# Patient Record
Sex: Female | Born: 1950 | Race: White | Hispanic: No | Marital: Single
Health system: Southern US, Community
[De-identification: ages and names within clinical notes are randomized; demographics above are authoritative.]

## PROBLEM LIST (undated history)

## (undated) DIAGNOSIS — I10 Essential (primary) hypertension: Secondary | ICD-10-CM

---

## 2006-01-27 ENCOUNTER — Ambulatory Visit: Payer: Self-pay | Admitting: Internal Medicine

## 2006-03-14 ENCOUNTER — Ambulatory Visit: Payer: Self-pay | Admitting: Unknown Physician Specialty

## 2008-01-17 ENCOUNTER — Ambulatory Visit: Payer: Self-pay | Admitting: Internal Medicine

## 2009-02-11 ENCOUNTER — Ambulatory Visit: Payer: Self-pay | Admitting: Internal Medicine

## 2011-02-02 ENCOUNTER — Ambulatory Visit: Payer: Self-pay | Admitting: Internal Medicine

## 2011-05-21 ENCOUNTER — Ambulatory Visit: Payer: Self-pay | Admitting: Unknown Physician Specialty

## 2011-05-24 LAB — PATHOLOGY REPORT

## 2013-09-20 ENCOUNTER — Ambulatory Visit: Payer: Self-pay | Admitting: Internal Medicine

## 2014-09-23 ENCOUNTER — Ambulatory Visit: Payer: Self-pay | Admitting: Internal Medicine

## 2015-08-19 ENCOUNTER — Other Ambulatory Visit: Payer: Self-pay | Admitting: Internal Medicine

## 2015-08-19 DIAGNOSIS — Z1231 Encounter for screening mammogram for malignant neoplasm of breast: Secondary | ICD-10-CM

## 2015-09-25 ENCOUNTER — Ambulatory Visit
Admission: RE | Admit: 2015-09-25 | Discharge: 2015-09-25 | Disposition: A | Discharge status: Home / Self Care | Payer: 59 | Source: Ambulatory Visit | Attending: Internal Medicine | Admitting: Internal Medicine

## 2015-09-25 DIAGNOSIS — Z1231 Encounter for screening mammogram for malignant neoplasm of breast: Secondary | ICD-10-CM | POA: Diagnosis present

## 2015-09-29 ENCOUNTER — Ambulatory Visit: Payer: Self-pay

## 2015-09-30 ENCOUNTER — Other Ambulatory Visit: Payer: Self-pay | Admitting: Internal Medicine

## 2015-09-30 DIAGNOSIS — R928 Other abnormal and inconclusive findings on diagnostic imaging of breast: Secondary | ICD-10-CM

## 2015-10-07 ENCOUNTER — Other Ambulatory Visit: Payer: Self-pay | Admitting: Internal Medicine

## 2015-10-07 DIAGNOSIS — N6489 Other specified disorders of breast: Secondary | ICD-10-CM

## 2015-10-13 ENCOUNTER — Ambulatory Visit
Admission: RE | Admit: 2015-10-13 | Discharge: 2015-10-13 | Disposition: A | Discharge status: Home / Self Care | Payer: 59 | Source: Ambulatory Visit | Attending: Internal Medicine | Admitting: Internal Medicine

## 2015-10-13 DIAGNOSIS — R928 Other abnormal and inconclusive findings on diagnostic imaging of breast: Secondary | ICD-10-CM | POA: Diagnosis present

## 2016-05-31 ENCOUNTER — Encounter: Admission: RE | Payer: Self-pay | Source: Ambulatory Visit

## 2016-05-31 ENCOUNTER — Ambulatory Visit: Admission: RE | Admit: 2016-05-31 | Payer: 59 | Source: Ambulatory Visit | Admitting: Unknown Physician Specialty

## 2016-05-31 SURGERY — COLONOSCOPY WITH PROPOFOL
Anesthesia: General

## 2016-09-17 ENCOUNTER — Other Ambulatory Visit: Payer: Self-pay | Admitting: Internal Medicine

## 2016-09-17 DIAGNOSIS — Z1231 Encounter for screening mammogram for malignant neoplasm of breast: Secondary | ICD-10-CM

## 2016-10-26 ENCOUNTER — Ambulatory Visit
Admission: RE | Admit: 2016-10-26 | Discharge: 2016-10-26 | Disposition: A | Discharge status: Home / Self Care | Payer: 59 | Source: Ambulatory Visit | Attending: Internal Medicine | Admitting: Internal Medicine

## 2016-10-26 DIAGNOSIS — Z1231 Encounter for screening mammogram for malignant neoplasm of breast: Secondary | ICD-10-CM | POA: Diagnosis not present

## 2017-09-16 ENCOUNTER — Other Ambulatory Visit: Payer: Self-pay | Admitting: Internal Medicine

## 2017-09-16 DIAGNOSIS — Z1231 Encounter for screening mammogram for malignant neoplasm of breast: Secondary | ICD-10-CM

## 2017-10-27 ENCOUNTER — Ambulatory Visit
Admission: RE | Admit: 2017-10-27 | Discharge: 2017-10-27 | Disposition: A | Discharge status: Home / Self Care | Payer: 59 | Source: Ambulatory Visit | Attending: Internal Medicine | Admitting: Internal Medicine

## 2017-10-27 DIAGNOSIS — Z1231 Encounter for screening mammogram for malignant neoplasm of breast: Secondary | ICD-10-CM | POA: Diagnosis present

## 2018-09-21 ENCOUNTER — Other Ambulatory Visit: Payer: Self-pay | Admitting: Internal Medicine

## 2018-09-21 DIAGNOSIS — Z1231 Encounter for screening mammogram for malignant neoplasm of breast: Secondary | ICD-10-CM

## 2018-11-13 ENCOUNTER — Ambulatory Visit
Admission: RE | Admit: 2018-11-13 | Discharge: 2018-11-13 | Disposition: A | Discharge status: Home / Self Care | Payer: Managed Care, Other (non HMO) | Source: Ambulatory Visit | Attending: Internal Medicine | Admitting: Internal Medicine

## 2018-11-13 DIAGNOSIS — Z1231 Encounter for screening mammogram for malignant neoplasm of breast: Secondary | ICD-10-CM | POA: Diagnosis not present

## 2019-09-27 ENCOUNTER — Other Ambulatory Visit: Payer: Self-pay | Admitting: Internal Medicine

## 2019-09-27 DIAGNOSIS — Z1231 Encounter for screening mammogram for malignant neoplasm of breast: Secondary | ICD-10-CM

## 2019-11-19 ENCOUNTER — Ambulatory Visit
Admission: RE | Admit: 2019-11-19 | Discharge: 2019-11-19 | Disposition: A | Discharge status: Home / Self Care | Payer: Managed Care, Other (non HMO) | Source: Ambulatory Visit | Attending: Internal Medicine | Admitting: Internal Medicine

## 2019-11-19 DIAGNOSIS — Z1231 Encounter for screening mammogram for malignant neoplasm of breast: Secondary | ICD-10-CM | POA: Diagnosis present

## 2020-12-19 ENCOUNTER — Other Ambulatory Visit: Payer: Self-pay | Admitting: Internal Medicine

## 2020-12-19 DIAGNOSIS — Z1231 Encounter for screening mammogram for malignant neoplasm of breast: Secondary | ICD-10-CM

## 2020-12-30 ENCOUNTER — Other Ambulatory Visit: Payer: Self-pay

## 2020-12-30 ENCOUNTER — Ambulatory Visit
Admission: RE | Admit: 2020-12-30 | Discharge: 2020-12-30 | Disposition: A | Discharge status: Home / Self Care | Payer: Managed Care, Other (non HMO) | Source: Ambulatory Visit | Attending: Internal Medicine | Admitting: Internal Medicine

## 2020-12-30 DIAGNOSIS — Z1231 Encounter for screening mammogram for malignant neoplasm of breast: Secondary | ICD-10-CM | POA: Insufficient documentation

## 2021-05-04 ENCOUNTER — Other Ambulatory Visit (HOSPITAL_COMMUNITY): Payer: Self-pay | Admitting: Specialist

## 2021-05-04 DIAGNOSIS — R0609 Other forms of dyspnea: Secondary | ICD-10-CM

## 2021-05-04 DIAGNOSIS — R0789 Other chest pain: Secondary | ICD-10-CM

## 2021-05-19 ENCOUNTER — Telehealth (HOSPITAL_COMMUNITY): Payer: Self-pay | Admitting: Emergency Medicine

## 2021-05-19 DIAGNOSIS — R079 Chest pain, unspecified: Secondary | ICD-10-CM

## 2021-05-19 MED ORDER — METOPROLOL TARTRATE 100 MG PO TABS: 100.0000 mg | ORAL_TABLET | Freq: Once | ORAL | 0 refills | Status: AC

## 2021-05-19 NOTE — Telephone Encounter (Signed)
Reaching out to patient to offer assistance regarding upcoming cardiac imaging study; pt verbalizes understanding of appt date/time, parking situation and where to check in, pre-test NPO status and medications ordered, and verified current allergies; name and call back number provided for further questions should they arise Rockwell Alexandria RN Navigator Cardiac Imaging [facility] [phone] office [phone] cell  [facility] this morning to request patient come have BMP drawn for CCTA on thur. Pt states she had labs drawn already (results pending)  100mg  metoprolol tartrate one time dose prescribed for HR control for CCTA. Sent to patients preferred pharmacy on file. Instructed patient to take 2 hr prior to scan  Denies iv issues Denies claustro 

## 2021-05-21 ENCOUNTER — Other Ambulatory Visit: Payer: Self-pay

## 2021-05-21 ENCOUNTER — Ambulatory Visit
Admission: RE | Admit: 2021-05-21 | Discharge: 2021-05-21 | Disposition: A | Discharge status: Home / Self Care | Payer: Medicare Other | Source: Ambulatory Visit | Attending: Specialist | Admitting: Specialist

## 2021-05-21 DIAGNOSIS — R0600 Dyspnea, unspecified: Secondary | ICD-10-CM | POA: Diagnosis not present

## 2021-05-21 DIAGNOSIS — R0789 Other chest pain: Secondary | ICD-10-CM | POA: Diagnosis present

## 2021-05-21 DIAGNOSIS — R0609 Other forms of dyspnea: Secondary | ICD-10-CM

## 2021-05-21 DIAGNOSIS — I10 Essential (primary) hypertension: Secondary | ICD-10-CM | POA: Insufficient documentation

## 2021-05-21 HISTORY — DX: Essential (primary) hypertension: I10

## 2021-05-21 MED ORDER — NITROGLYCERIN 0.4 MG SL SUBL
0.8000 mg | SUBLINGUAL_TABLET | Freq: Once | SUBLINGUAL | Status: AC
  Administered 2021-05-21: 0.8 mg via SUBLINGUAL

## 2021-05-21 MED ORDER — IOHEXOL 350 MG/ML SOLN
75.0000 mL | Freq: Once | INTRAVENOUS | Status: AC | PRN
  Administered 2021-05-21: 75 mL via INTRAVENOUS

## 2021-05-21 NOTE — Progress Notes (Signed)
Patient tolerated procedure well. Ambulate w/o difficulty. Sitting in chair drinking water provided. Encouraged to drink extra water today and reasoning explained. Verbalized understanding. All questions answered. ABC intact. No further needs. Discharge from procedure area w/o issues.  

## 2021-11-23 ENCOUNTER — Other Ambulatory Visit: Payer: Self-pay | Admitting: Internal Medicine

## 2021-11-23 DIAGNOSIS — Z1231 Encounter for screening mammogram for malignant neoplasm of breast: Secondary | ICD-10-CM

## 2021-12-31 ENCOUNTER — Other Ambulatory Visit: Payer: Self-pay

## 2021-12-31 ENCOUNTER — Ambulatory Visit
Admission: RE | Admit: 2021-12-31 | Discharge: 2021-12-31 | Disposition: A | Discharge status: Home / Self Care | Payer: Medicare Other | Source: Ambulatory Visit | Attending: Internal Medicine | Admitting: Internal Medicine

## 2021-12-31 DIAGNOSIS — Z1231 Encounter for screening mammogram for malignant neoplasm of breast: Secondary | ICD-10-CM | POA: Insufficient documentation

## 2022-11-23 ENCOUNTER — Other Ambulatory Visit: Payer: Self-pay | Admitting: Internal Medicine

## 2022-11-23 DIAGNOSIS — Z1231 Encounter for screening mammogram for malignant neoplasm of breast: Secondary | ICD-10-CM

## 2023-01-03 ENCOUNTER — Ambulatory Visit
Admission: RE | Admit: 2023-01-03 | Discharge: 2023-01-03 | Disposition: A | Discharge status: Home / Self Care | Payer: Medicare Other | Source: Ambulatory Visit | Attending: Internal Medicine | Admitting: Internal Medicine

## 2023-01-03 DIAGNOSIS — Z1231 Encounter for screening mammogram for malignant neoplasm of breast: Secondary | ICD-10-CM | POA: Diagnosis present

## 2023-03-29 IMAGING — CT CT HEART MORP W/ CTA COR W/ SCORE W/ CA W/CM &/OR W/O CM
1 of 14 series · 3 of 20 positions shown, 4 images · non-contrast
Comparison: None.

Addendum:
CLINICAL DATA: Chest pain, shortness of breath

EXAM:
Cardiac/Coronary  CTA
TECHNIQUE: The patient was scanned on a Siemens Somatom go.Top scanner.

[Series 26: multiphase % cta coronary 0.60 · axial · 0.33mm/px · z∈[-1108,-1046]mm · 3 of 3443 slices shown, 4 images]
[im 861/3443  vessel]
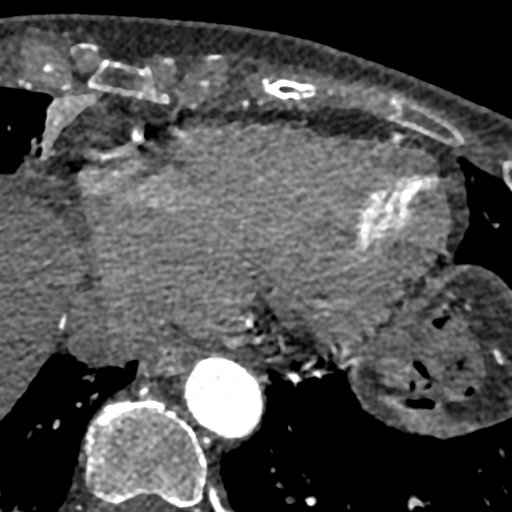
[im 861/3443  lung]
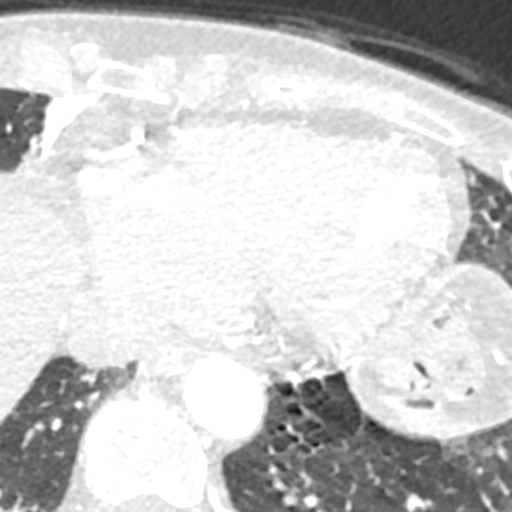
[im 1722/3443  vessel]
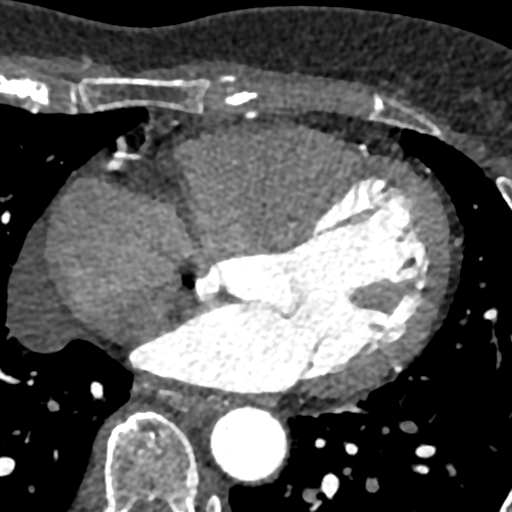
[im 2582/3443  vessel]
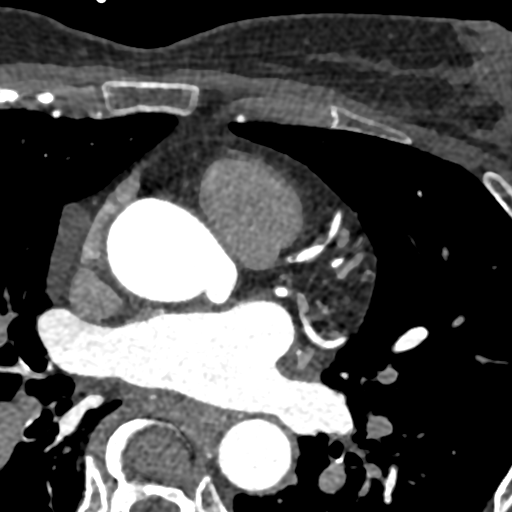

[3 of 20 positions shown; findings below may reference images not displayed]



Aortic Valve:  Trileaflet.  No calcifications.

Coronary Arteries:  Normal coronary origin.  Right dominance.

RCA is a dominant artery that gives rise to PDA and PLA. There is no
plaque.

Left main gives rise to LAD and LCX arteries. There is no disease in
the left main.

LAD has no plaque.

LCX is a non-dominant artery that gives rise to one large OM1
branch. There is no plaque.

Other findings:

Normal pulmonary vein drainage into the left atrium.

Normal left atrial appendage without a thrombus.

Normal size of the pulmonary artery.
IMPRESSION: 1. Normal coronary calcium score of 0. Patient is low risk for
coronary events.

2. Normal coronary origin with right dominance.

3. No evidence of CAD.

4. CAD-RADS 0. Consider non-atherosclerotic causes of chest pain.

EXAM:
OVER-READ INTERPRETATION  CT CHEST

The following report is an over-read performed by radiologist Dr.
Genny Soderberg [[email or web address]] on 05/22/2021. This over-read
does not include interpretation of cardiac or coronary anatomy or
pathology. The coronary CTA interpretation by the cardiologist is
attached.
FINDINGS: The visualized portions of the lower lung fields show no suspicious
nodules, masses, or infiltrates. No pleural fluid seen.

4.0 x 2.2 cm homogeneous water density lesion adjacent to the right
heart demonstrates no perceptible wall or internal architecture. The
visualized portions of the mediastinum and chest wall are otherwise
unremarkable.
IMPRESSION: 4.0 x 2.2 cm homogeneous water density lesion adjacent to the right
heart border. Imaging features are most suggestive of a pericardial
cyst. Follow-up CT chest in 3 months recommended to ensure
stability.



Aortic Valve:  Trileaflet.  No calcifications.

Coronary Arteries:  Normal coronary origin.  Right dominance.

RCA is a dominant artery that gives rise to PDA and PLA. There is no
plaque.

Left main gives rise to LAD and LCX arteries. There is no disease in
the left main.

LAD has no plaque.

LCX is a non-dominant artery that gives rise to one large OM1
branch. There is no plaque.

Other findings:

Normal pulmonary vein drainage into the left atrium.

Normal left atrial appendage without a thrombus.

Normal size of the pulmonary artery.
IMPRESSION: 1. Normal coronary calcium score of 0. Patient is low risk for
coronary events.

2. Normal coronary origin with right dominance.

3. No evidence of CAD.

4. CAD-RADS 0. Consider non-atherosclerotic causes of chest pain.

## 2023-11-28 ENCOUNTER — Other Ambulatory Visit: Payer: Self-pay | Admitting: Internal Medicine

## 2023-11-28 DIAGNOSIS — Z1231 Encounter for screening mammogram for malignant neoplasm of breast: Secondary | ICD-10-CM

## 2024-01-09 ENCOUNTER — Ambulatory Visit
Admission: RE | Admit: 2024-01-09 | Discharge: 2024-01-09 | Disposition: A | Discharge status: Home / Self Care | Payer: Medicare Other | Source: Ambulatory Visit | Attending: Internal Medicine | Admitting: Internal Medicine

## 2024-01-09 DIAGNOSIS — Z1231 Encounter for screening mammogram for malignant neoplasm of breast: Secondary | ICD-10-CM | POA: Insufficient documentation

## 2024-11-12 ENCOUNTER — Ambulatory Visit: Admitting: Anesthesiology

## 2024-11-12 ENCOUNTER — Ambulatory Visit
Admission: RE | Admit: 2024-11-12 | Discharge: 2024-11-12 | Disposition: A | Discharge status: Home / Self Care | Attending: Gastroenterology | Admitting: Gastroenterology

## 2024-11-12 ENCOUNTER — Encounter: Payer: Self-pay | Admitting: Gastroenterology

## 2024-11-12 ENCOUNTER — Encounter
Admission: RE | Disposition: A | Discharge status: Home / Self Care | Payer: Self-pay | Source: Home / Self Care | Attending: Gastroenterology

## 2024-11-12 DIAGNOSIS — Z79899 Other long term (current) drug therapy: Secondary | ICD-10-CM | POA: Diagnosis not present

## 2024-11-12 DIAGNOSIS — D124 Benign neoplasm of descending colon: Secondary | ICD-10-CM | POA: Diagnosis not present

## 2024-11-12 DIAGNOSIS — Z800 Family history of malignant neoplasm of digestive organs: Secondary | ICD-10-CM | POA: Insufficient documentation

## 2024-11-12 DIAGNOSIS — Z1211 Encounter for screening for malignant neoplasm of colon: Secondary | ICD-10-CM | POA: Diagnosis present

## 2024-11-12 DIAGNOSIS — I10 Essential (primary) hypertension: Secondary | ICD-10-CM | POA: Insufficient documentation

## 2024-11-12 DIAGNOSIS — J45909 Unspecified asthma, uncomplicated: Secondary | ICD-10-CM | POA: Insufficient documentation

## 2024-11-12 DIAGNOSIS — K641 Second degree hemorrhoids: Secondary | ICD-10-CM | POA: Diagnosis not present

## 2024-11-12 DIAGNOSIS — Z83719 Family history of colon polyps, unspecified: Secondary | ICD-10-CM | POA: Insufficient documentation

## 2024-11-12 HISTORY — PX: COLONOSCOPY: SHX5424

## 2024-11-12 HISTORY — PX: POLYPECTOMY: SHX149

## 2024-11-12 SURGERY — COLONOSCOPY
Anesthesia: General

## 2024-11-12 MED ORDER — SODIUM CHLORIDE 0.9 % IV SOLN: INTRAVENOUS | Status: DC

## 2024-11-12 MED ORDER — PROPOFOL 10 MG/ML IV BOLUS
INTRAVENOUS | Status: DC | PRN
  Administered 2024-11-12 (×4): 50 mg via INTRAVENOUS

## 2024-11-12 MED ORDER — LIDOCAINE HCL (CARDIAC) PF 100 MG/5ML IV SOSY
PREFILLED_SYRINGE | INTRAVENOUS | Status: DC | PRN
  Administered 2024-11-12: 60 mg via INTRAVENOUS

## 2024-11-12 MED ORDER — ONDANSETRON HCL 4 MG/2ML IJ SOLN
INTRAMUSCULAR | Status: AC
  Filled 2024-11-12: qty 2

## 2024-11-12 MED ORDER — DEXMEDETOMIDINE HCL IN NACL 80 MCG/20ML IV SOLN
INTRAVENOUS | Status: DC | PRN
  Administered 2024-11-12: 12 ug via INTRAVENOUS
  Administered 2024-11-12: 8 ug via INTRAVENOUS

## 2024-11-12 MED ORDER — LIDOCAINE HCL (PF) 2 % IJ SOLN
INTRAMUSCULAR | Status: AC
  Filled 2024-11-12: qty 5

## 2024-11-12 MED ORDER — PROPOFOL 500 MG/50ML IV EMUL
INTRAVENOUS | Status: DC | PRN
  Administered 2024-11-12: 75 ug/kg/min via INTRAVENOUS

## 2024-11-12 NOTE — Anesthesia Postprocedure Evaluation (Signed)
"   Anesthesia Post Note  Patient: Nancy Ho [facility]  Procedure(s) Performed: COLONOSCOPY POLYPECTOMY, INTESTINE  Patient location during evaluation: PACU Anesthesia Type: General Level of consciousness: awake Pain management: pain level controlled Vital Signs Assessment: post-procedure vital signs reviewed and stable Cardiovascular status: stable Anesthetic complications: no   No notable events documented.   Last Vitals:  Vitals:   11/12/24 1235 11/12/24 1247  BP: 120/73 124/75  Pulse: 73 71  Resp: 11 12  Temp:    SpO2: 99% 99%    Last Pain:  Vitals:   11/12/24 1235  TempSrc:   PainSc: 0-No pain                 VAN STAVEREN,[place] Mokry      "

## 2024-11-12 NOTE — Op Note (Signed)
 [facility] Gastroenterology Patient Name: Nancy Ho Procedure Date: 11/12/2024 11:41 AM MRN: [id number] Account #: [id number] Date of Birth: [birth year] Admit Type: Outpatient Age: 74 Room: [facility] ENDO ROOM 2 Gender: Female Note Status: Finalized Instrument Name: Colon Scope [id number] Procedure:             Colonoscopy Indications:           High risk colon cancer surveillance: Personal history                         of colonic polyps, Family history of colon cancer                         (father 56s) Providers:             Elspeth Ozell Onita ROSALEA, DO Referring MD:          Layman ORN. Lenon MD, MD (Referring MD) Medicines:             Monitored Anesthesia Care Complications:         No immediate complications. Estimated blood loss:                         Minimal. Procedure:             Pre-Anesthesia Assessment:                        - Prior to the procedure, a History and Physical was                         performed, and patient medications and allergies were                         reviewed. The patient is competent. The risks and                         benefits of the procedure and the sedation options and                         risks were discussed with the patient. All questions                         were answered and informed consent was obtained.                         Patient identification and proposed procedure were                         verified by the physician, the nurse, the anesthetist                         and the technician in the endoscopy suite. Mental                         Status Examination: alert and oriented. Airway                         Examination: normal oropharyngeal airway and neck  mobility. Respiratory Examination: clear to                         auscultation. CV Examination: RRR, no murmurs, no S3                         or S4. Prophylactic Antibiotics: The patient does not                          require prophylactic antibiotics. Prior                         Anticoagulants: The patient has taken no anticoagulant                         or antiplatelet agents. ASA Grade Assessment: II - A                         patient with mild systemic disease. After reviewing                         the risks and benefits, the patient was deemed in                         satisfactory condition to undergo the procedure. The                         anesthesia plan was to use monitored anesthesia care                         (MAC). Immediately prior to administration of                         medications, the patient was re-assessed for adequacy                         to receive sedatives. The heart rate, respiratory                         rate, oxygen saturations, blood pressure, adequacy of                         pulmonary ventilation, and response to care were                         monitored throughout the procedure. The physical                         status of the patient was re-assessed after the                         procedure.                        After obtaining informed consent, the colonoscope was                         passed under direct vision. Throughout the procedure,  the patient's blood pressure, pulse, and oxygen                         saturations were monitored continuously. The                         Colonoscope was introduced through the anus and                         advanced to the the cecum, identified by appendiceal                         orifice and ileocecal valve. The colonoscopy was                         performed without difficulty. The patient tolerated                         the procedure well. The quality of the bowel                         preparation was evaluated using the BBPS [facility] Bowel                         Preparation Scale) with scores of: Right Colon = 2                         (minor amount of  residual staining, small fragments of                         stool and/or opaque liquid, but mucosa seen well),                         Transverse Colon = 2 (minor amount of residual                         staining, small fragments of stool and/or opaque                         liquid, but mucosa seen well) and Left Colon = 2                         (minor amount of residual staining, small fragments of                         stool and/or opaque liquid, but mucosa seen well). The                         total BBPS score equals 6. The quality of the bowel                         preparation was good. The ileocecal valve, appendiceal                         orifice, and rectum were photographed. Findings:      The perianal and digital rectal examinations were normal. Pertinent       negatives  include normal sphincter tone.      A 2 to 3 mm polyp was found in the descending colon. The polyp was       sessile. The polyp was removed with a cold snare. Resection and       retrieval were complete. Estimated blood loss was minimal.      Non-bleeding internal hemorrhoids were found during retroflexion. The       hemorrhoids were Grade II (internal hemorrhoids that prolapse but reduce       spontaneously). Estimated blood loss: none.      The exam was otherwise without abnormality on direct and retroflexion       views. Impression:            - One 2 to 3 mm polyp in the descending colon, removed                         with a cold snare. Resected and retrieved.                        - Non-bleeding internal hemorrhoids.                        - The examination was otherwise normal on direct and                         retroflexion views. Recommendation:        - Patient has a contact number available for                         emergencies. The signs and symptoms of potential                         delayed complications were discussed with the patient.                         Return to normal  activities tomorrow. Written                         discharge instructions were provided to the patient.                        - Discharge patient to home.                        - Resume previous diet.                        - Continue present medications.                        - Await pathology results.                        - Repeat colonoscopy in 5 years for surveillance based                         on pathology results.                        - Return to referring physician as previously  scheduled.                        - The findings and recommendations were discussed with                         the patient. Procedure Code(s):     --- Professional ---                        [phone], Colonoscopy, flexible; with removal of                         tumor(s), polyp(s), or other lesion(s) by snare                         technique Diagnosis Code(s):     --- Professional ---                        Z86.010, Personal history of colonic polyps                        D12.4, Benign neoplasm of descending colon                        K64.1, Second degree hemorrhoids CPT copyright 2022 American Medical Association. All rights reserved. The codes documented in this report are preliminary and upon coder review may  be revised to meet current compliance requirements. Attending Participation:      I personally performed the entire procedure. Elspeth Jungling, DO Elspeth Ozell Jungling DO, DO 11/12/2024 87:73:77 PM This report has been signed electronically. Number of Addenda: 0 Note Initiated On: 11/12/2024 11:41 AM Scope Withdrawal Time: 0 hours 17 minutes 19 seconds  Total Procedure Duration: 0 hours 26 minutes 22 seconds  Estimated Blood Loss:  Estimated blood loss was minimal.      [facility]

## 2024-11-12 NOTE — Anesthesia Preprocedure Evaluation (Addendum)
"                                    Anesthesia Evaluation  Patient identified by MRN, date of birth, ID band Patient awake    Reviewed: Allergy & Precautions, NPO status , Patient's Chart, lab work & pertinent test results  Airway Mallampati: II  TM Distance: >3 FB Neck ROM: Full    Dental  (+) Teeth Intact   Pulmonary neg pulmonary ROS, asthma    Pulmonary exam normal        Cardiovascular Exercise Tolerance: Good hypertension, Pt. on medications Normal cardiovascular exam Rhythm:Regular Rate:Normal     Neuro/Psych negative neurological ROS  negative psych ROS   GI/Hepatic negative GI ROS, Neg liver ROS,,,  Endo/Other  negative endocrine ROS    Renal/GU negative Renal ROS  negative genitourinary   Musculoskeletal   Abdominal Normal abdominal exam  (+)   Peds negative pediatric ROS (+)  Hematology negative hematology ROS (+)   Anesthesia Other Findings Past Medical History: No date: Hypertension  History reviewed. No pertinent surgical history.  BMI    Body Mass Index: 25.24 kg/m      Reproductive/Obstetrics negative OB ROS                              Anesthesia Physical Anesthesia Plan  ASA: 2  Anesthesia Plan: General   Post-op Pain Management:    Induction: Intravenous  PONV Risk Score and Plan: Propofol  infusion and TIVA  Airway Management Planned: Natural Airway and Nasal Cannula  Additional Equipment:   Intra-op Plan:   Post-operative Plan:   Informed Consent: I have reviewed the patients History and Physical, chart, labs and discussed the procedure including the risks, benefits and alternatives for the proposed anesthesia with the patient or authorized representative who has indicated his/her understanding and acceptance.     Dental Advisory Given  Plan Discussed with: CRNA  Anesthesia Plan Comments:          Anesthesia Quick Evaluation  "

## 2024-11-12 NOTE — Transfer of Care (Signed)
 Immediate Anesthesia Transfer of Care Note  Patient: Nancy Ho  Procedure(s) Performed: COLONOSCOPY POLYPECTOMY, INTESTINE  Patient Location: PACU  Anesthesia Type:General  Level of Consciousness: sedated  Airway & Oxygen Therapy: Patient Spontanous Breathing  Post-op Assessment: Report given to RN and Post -op Vital signs reviewed and stable  Post vital signs: Reviewed and stable  Last Vitals:  Vitals Value Taken Time  BP 108/69 11/12/24 12:25  Temp    Pulse 77 11/12/24 12:25  Resp 15 11/12/24 12:25  SpO2 98 % 11/12/24 12:25    Last Pain:  Vitals:   11/12/24 1012  TempSrc: Temporal  PainSc: 0-No pain         Complications: No notable events documented.

## 2024-11-12 NOTE — H&P (Signed)
 "  Pre-Procedure H&P   Patient ID: Nancy Ho is a 74 y.o. female.  Gastroenterology Provider: Elspeth Ozell Jungling, DO  Referring Provider: Lynda Rummer, PA PCP: Lenon Layman ORN, MD  Date: 11/12/2024  HPI Nancy Ho is a 74 y.o. female who presents today for Colonoscopy for Personal history of colon polyps; family history of colon cancer; family history of colon polyps.  No current GI symptoms  Last underwent colonoscopy in January 2023 with cecal polyp that was sessile serrated, and a ascending colon polyp that was not retrieved.  3-year repeat recommended CSY- 2017- [facility]  Father with history colon cancer in his 30s.  Paternal grandmother with colon cancer Brother and sister with colon polyps    Past Medical History:  Diagnosis Date   Hypertension     History reviewed. No pertinent surgical history.  Family History Father with history colon cancer in his 30s.  Paternal grandmother with colon cancer Brother and sister with colon polyps No other h/o GI disease or malignancy  Review of Systems  Constitutional:  Negative for activity change, appetite change, chills, diaphoresis, fatigue, fever and unexpected weight change.  HENT:  Negative for trouble swallowing and voice change.   Respiratory:  Negative for shortness of breath and wheezing.   Cardiovascular:  Negative for chest pain, palpitations and leg swelling.  Gastrointestinal:  Negative for abdominal distention, abdominal pain, anal bleeding, blood in stool, constipation, diarrhea, nausea, rectal pain and vomiting.  Musculoskeletal:  Negative for arthralgias and myalgias.  Skin:  Negative for color change and pallor.  Neurological:  Negative for dizziness, syncope and weakness.  Psychiatric/Behavioral:  Negative for confusion.   All other systems reviewed and are negative.    Medications Medications Ordered Prior to Encounter[1]  Pertinent medications related to GI and procedure were  reviewed by me with the patient prior to the procedure  Current Medications[2]  sodium chloride  20 mL/hr at 11/12/24 1032       Allergies[3] Allergies were reviewed by me prior to the procedure  Objective   Body mass index is 25.24 kg/m. Vitals:   11/12/24 1012  BP: (!) 163/82  Pulse: 91  Resp: 16  Temp: 97.6 F (36.4 C)  TempSrc: Temporal  SpO2: 99%  Weight: 70.9 kg  Height: 5' 6 (1.676 m)     Physical Exam Vitals and nursing note reviewed.  Constitutional:      General: She is not in acute distress.    Appearance: Normal appearance. She is not ill-appearing, toxic-appearing or diaphoretic.  HENT:     Head: Normocephalic and atraumatic.     Nose: Nose normal.     Mouth/Throat:     Mouth: Mucous membranes are moist.     Pharynx: Oropharynx is clear.  Eyes:     General: No scleral icterus.    Extraocular Movements: Extraocular movements intact.  Cardiovascular:     Rate and Rhythm: Normal rate and regular rhythm.     Heart sounds: Normal heart sounds. No murmur heard.    No friction rub. No gallop.  Pulmonary:     Effort: Pulmonary effort is normal. No respiratory distress.     Breath sounds: Normal breath sounds. No wheezing, rhonchi or rales.  Abdominal:     General: Bowel sounds are normal. There is no distension.     Palpations: Abdomen is soft.     Tenderness: There is no abdominal tenderness. There is no guarding or rebound.  Musculoskeletal:     Cervical back:  Neck supple.     Right lower leg: No edema.     Left lower leg: No edema.  Skin:    General: Skin is warm and dry.     Coloration: Skin is not jaundiced or pale.  Neurological:     General: No focal deficit present.     Mental Status: She is alert and oriented to person, place, and time. Mental status is at baseline.  Psychiatric:        Mood and Affect: Mood normal.        Behavior: Behavior normal.        Thought Content: Thought content normal.        Judgment: Judgment normal.       Assessment:  Nancy Ho is a 74 y.o. female  who presents today for Colonoscopy for Personal history of colon polyps; family history of colon cancer; family history of colon polyps.  Plan:  Colonoscopy with possible intervention today  Colonoscopy with possible biopsy, control of bleeding, polypectomy, and interventions as necessary has been discussed with the patient/patient representative. Informed consent was obtained from the patient/patient representative after explaining the indication, nature, and risks of the procedure including but not limited to death, bleeding, perforation, missed neoplasm/lesions, cardiorespiratory compromise, and reaction to medications. Opportunity for questions was given and appropriate answers were provided. Patient/patient representative has verbalized understanding is amenable to undergoing the procedure.   Elspeth Ozell Jungling, DO  [facility]  Portions of the record may have been created with voice recognition software. Occasional wrong-word or 'sound-a-like' substitutions may have occurred due to the inherent limitations of voice recognition software.  Read the chart carefully and recognize, using context, where substitutions may have occurred.     [1]  No current facility-administered medications on file prior to encounter.   Current Outpatient Medications on File Prior to Encounter  Medication Sig Dispense Refill   amLODipine (NORVASC) 5 MG tablet Take 5 mg by mouth daily. Started taking a couple years ago. Prescribed by Dr. Lenon     metoprolol  tartrate (LOPRESSOR ) 100 MG tablet Take 1 tablet (100 mg total) by mouth once for 1 dose. Please take one time dose 100mg  metoprolol  tartrate 2 hr prior to cardiac CT for HR control IF HR >55bpm. 1 tablet 0  [2]  Current Facility-Administered Medications:    0.9 %  sodium chloride  infusion, , Intravenous, Continuous, Jungling Elspeth Ozell, DO, Last Rate: 20 mL/hr at  11/12/24 1032, New Bag at 11/12/24 1032 [3] Not on File  "

## 2024-11-12 NOTE — Interval H&P Note (Signed)
 History and Physical Interval Note: Preprocedure H&P from 11/12/2024  was reviewed and there was no interval change after seeing and examining the patient.  Written consent was obtained from the patient after discussion of risks, benefits, and alternatives. Patient has consented to proceed with Colonoscopy with possible intervention   11/12/2024 11:39 AM  Nancy Ho  has presented today for surgery, with the diagnosis of Family history of colon cancer (Z80.0) Colon cancer screening (Z12.11) History of colon polyps (Z86.0100).  The various methods of treatment have been discussed with the patient and family. After consideration of risks, benefits and other options for treatment, the patient has consented to  Procedures: COLONOSCOPY (N/A) as a surgical intervention.  The patient's history has been reviewed, patient examined, no change in status, stable for surgery.  I have reviewed the patient's chart and labs.  Questions were answered to the patient's satisfaction.     Elspeth Ozell Jungling

## 2024-11-13 LAB — SURGICAL PATHOLOGY

## 2024-11-30 ENCOUNTER — Other Ambulatory Visit: Payer: Self-pay | Admitting: Internal Medicine

## 2024-11-30 DIAGNOSIS — Z1231 Encounter for screening mammogram for malignant neoplasm of breast: Secondary | ICD-10-CM

## 2025-01-09 ENCOUNTER — Encounter
